# Patient Record
Sex: Male | Born: 1965 | Race: Black or African American | Hispanic: No | Marital: Married | Smoking: Never smoker
Health system: Southern US, Community
[De-identification: ages and names within clinical notes are randomized; demographics above are authoritative.]

## PROBLEM LIST (undated history)

## (undated) DIAGNOSIS — I10 Essential (primary) hypertension: Secondary | ICD-10-CM

---

## 2012-05-16 ENCOUNTER — Emergency Department (HOSPITAL_COMMUNITY)
Admission: EM | Admit: 2012-05-16 | Discharge: 2012-05-16 | Disposition: A | Discharge status: Home / Self Care | Payer: BC Managed Care – PPO | Source: Home / Self Care

## 2012-05-16 ENCOUNTER — Encounter (HOSPITAL_COMMUNITY): Payer: Self-pay | Admitting: *Deleted

## 2012-05-16 DIAGNOSIS — M79609 Pain in unspecified limb: Secondary | ICD-10-CM

## 2012-05-16 DIAGNOSIS — M79673 Pain in unspecified foot: Secondary | ICD-10-CM

## 2012-05-16 HISTORY — DX: Essential (primary) hypertension: I10

## 2012-05-16 MED ORDER — TRAMADOL HCL 50 MG PO TABS: 50.0000 mg | ORAL_TABLET | Freq: Four times a day (QID) | ORAL | Status: AC | PRN

## 2012-05-16 NOTE — ED Provider Notes (Signed)
Derrick Ray is a 46 y.o. male who presents to Urgent Care today for pain in his right heel.  Patient stepped off of a curb today while at work and felt a pop and immediate pain in his right posterior heel. He has had difficulty walking since he became injured.  He denies any other issues or problems today. He feels well. Denies any past history of foot injuries.   PMH reviewed. Significant for hypertension History  Substance Use Topics  . Smoking status: Not on file  . Smokeless tobacco: Not on file  . Alcohol Use: Not on file   ROS as above Medications reviewed. No current facility-administered medications for this encounter.   No current outpatient prescriptions on file.    Exam:  There were no vitals taken for this visit. Gen: Well NAD RIGHT FOOT:   Tender to palpation at the insertion of the Achilles tendon on the calcaneus.   Normal strength of peroneal tendon and posterior tibialis tendons. Pain with resisted plantar flexion of the foot.   Patient is unable to stand on his right toes.  Tompkins calf squeeze test is intact bilaterally   No results found for this or any previous visit (from the past 24 hour(s)). No results found.  Assessment and Plan: 46 y.o. male with suspected partial Achilles tendon tear.  Will place patient in [employer] and have him followup with sports medicine orthopedics.  Additionally we'll provide tramadol as needed for pain.  Discussed plan with patient who expresses understanding.     Rodolph Bong, MD 05/16/12 [phone]

## 2012-05-16 NOTE — Discharge Instructions (Signed)
Thank you for coming in today.  I am worried about a partial achilles tendon tear.  Please follow up with [facility]  [phone]) or any ortho practice in town. I listed [facility] but [facility], [facility], [facility] would all be OK.  Also call your occupational people at work and see who they want you to see.  Use tramadol as needed for pain.

## 2012-05-16 NOTE — ED Notes (Signed)
MD at bedside. 

## 2012-05-16 NOTE — ED Notes (Signed)
PT REPORTS STEPPING OFF SIDEWALK TODAY WITH RIGHT FOOT AND HEARING/FEELING POP IN FOOT /LOWER LEG. UNABLE TO APPLY PRESSURE TO FOOT, DIFFICULT TO WALK.

## 2012-05-17 NOTE — ED Provider Notes (Signed)
Medical screening examination/treatment/procedure(s) were performed by PGY-3 FM resident and as supervising physician I was immediately available for consultation/collaboration.   Sharin Grave, MD   Sharin Grave, MD 05/17/12 [phone]

## 2021-05-12 ENCOUNTER — Encounter: Payer: Self-pay | Admitting: Emergency Medicine

## 2021-05-12 ENCOUNTER — Other Ambulatory Visit: Payer: Self-pay

## 2021-05-12 ENCOUNTER — Emergency Department (INDEPENDENT_AMBULATORY_CARE_PROVIDER_SITE_OTHER)
Admission: EM | Admit: 2021-05-12 | Discharge: 2021-05-12 | Disposition: A | Discharge status: Home / Self Care | Payer: BC Managed Care – PPO | Source: Home / Self Care

## 2021-05-12 DIAGNOSIS — M546 Pain in thoracic spine: Secondary | ICD-10-CM

## 2021-05-12 DIAGNOSIS — S46812A Strain of other muscles, fascia and tendons at shoulder and upper arm level, left arm, initial encounter: Secondary | ICD-10-CM | POA: Diagnosis not present

## 2021-05-12 MED ORDER — CYCLOBENZAPRINE HCL 10 MG PO TABS: 10.0000 mg | ORAL_TABLET | Freq: Two times a day (BID) | ORAL | 0 refills | Status: DC | PRN

## 2021-05-12 NOTE — ED Triage Notes (Addendum)
Patient here reporting one week of pain along upper back that focuses in left upper shoulder blade area; unable to get comfortable to sleep; no known injury or motion that could have caused it; has tried OTCs. Has had covid vaccinations. Did have covid 2/21.

## 2021-05-12 NOTE — ED Provider Notes (Signed)
[facility]   [id number] 05/12/21 Arrival Time: 1846  [id number] PAIN  SUBJECTIVE: History from: patient. Derrick Ray is a 55 y.o. male complains of left thoracic back pain that began about 2-3 days ago. Denies a precipitating event or specific injury.  Describes the pain as constant and achy in character.  Has tried OTC medications without relief. Symptoms are made worse with activity. Denies similar symptoms in the past. Denies fever, chills, erythema, ecchymosis, effusion, weakness, numbness and tingling, saddle paresthesias, loss of bowel or bladder function.      ROS: As per HPI.  All other pertinent ROS negative.     Past Medical History:  Diagnosis Date   Diabetes mellitus    Hypertension    No past surgical history on file. No Known Allergies No current facility-administered medications on file prior to encounter.   Current Outpatient Medications on File Prior to Encounter  Medication Sig Dispense Refill   dapagliflozin propanediol (FARXIGA) 10 MG TABS tablet Take 10 mg by mouth daily.     metFORMIN (GLUCOPHAGE) 500 MG tablet Take by mouth daily with breakfast.     Semaglutide (OZEMPIC, 1 MG/DOSE, [place]) Inject into the skin once a week.     testosterone cypionate (DEPOTESTOSTERONE CYPIONATE) 200 MG/ML injection Inject into the muscle every 14 (fourteen) days.     insulin detemir (LEVEMIR) 100 UNIT/ML injection Inject 18 Units into the skin at bedtime.     levalbuterol (XOPENEX) 0.31 MG/3ML nebulizer solution Take 1 ampule by nebulization every 4 (four) hours as needed.     lisinopril (PRINIVIL,ZESTRIL) 10 MG tablet Take 10 mg by mouth daily.     Social History   Socioeconomic History   Marital status: Married    Spouse name: Not on file   Number of children: Not on file   Years of education: Not on file   Highest education level: Not on file  Occupational History   Not on file  Tobacco Use   Smoking status: Never   Smokeless tobacco: Not on file  Substance  and Sexual Activity   Alcohol use: No   Drug use: No   Sexual activity: Not on file  Other Topics Concern   Not on file  Social History Narrative   Not on file   Social Determinants of Health   Financial Resource Strain: Not on file  Food Insecurity: Not on file  Transportation Needs: Not on file  Physical Activity: Not on file  Stress: Not on file  Social Connections: Not on file  Intimate Partner Violence: Not on file   Family History  Family history unknown: Yes    OBJECTIVE:  Vitals:   05/12/21 1904 05/12/21 1911 05/12/21 1924  BP:  128/78   Pulse:  (!) 6 61  Resp:  16   Temp:  98.4 F (36.9 C)   TempSrc:  Oral   SpO2:  96%   Weight: 275 lb (124.7 kg)    Height: 5\' 8"  (1.727 m)      General appearance: ALERT; in no acute distress.  Head: NCAT Lungs: Normal respiratory effort CV: pulses 2+ bilaterally. Cap refill < 2 seconds Musculoskeletal:  Inspection: Skin warm, dry, clear and intact No erythema, effusion noted Palpation: Left trapezius along scapular spine tender to palpation and in spasm ROM: Limited ROM active and passive to L shoulder/neck Skin: warm and dry Neurologic: Ambulates without difficulty; Sensation intact about the upper/ lower extremities Psychological: alert and cooperative; normal mood and affect  DIAGNOSTIC STUDIES:  No results found.   ASSESSMENT & PLAN:  1. Trapezius strain, left, initial encounter   2. Acute left-sided thoracic back pain     Meds ordered this encounter  Medications   cyclobenzaprine (FLEXERIL) 10 MG tablet    Sig: Take 1 tablet (10 mg total) by mouth 2 (two) times daily as needed for muscle spasms.    Dispense:  20 tablet    Refill:  0    Order Specific Question:   Supervising Provider    Answer:   Merrilee Jansky [id number]    Continue conservative management of rest, ice, and gentle stretches Take ibuprofen as needed for pain relief (may cause abdominal discomfort, ulcers, and GI bleeds avoid taking  with other NSAIDs) Take cyclobenzaprine at nighttime for symptomatic relief. Avoid driving or operating heavy machinery while using medication. May take 800 mg ibuprofen with 1000 mg of Tylenol.  Do not exceed 4000 mg of Tylenol in 24 hours. May use heat to the area Follow up with PCP if symptoms persist Return or go to the ER if you have any new or worsening symptoms (fever, chills, chest pain, abdominal pain, changes in bowel or bladder habits, pain radiating into lower legs)    Reviewed expectations re: course of current medical issues. Questions answered. Outlined signs and symptoms indicating need for more acute intervention. Patient verbalized understanding. After Visit Summary given.        Moshe Cipro, NP 05/12/21 1935

## 2021-05-12 NOTE — Discharge Instructions (Addendum)
I would recommend getting a massage  I have sent in flexeril for you to take twice a day as needed for muscle spasms. This medication can make you sleepy. Do not drive or operate heavy machinery with this medication.  May take 800 mg ibuprofen with 1000 mg of Tylenol.  Do not exceed 4000 mg of Tylenol in 24 hours.  May use heat to the area  May use topical rubs to the area as well  Follow up with this office or with primary care if symptoms are persisting.  Follow up in the ER for high fever, trouble swallowing, trouble breathing, other concerning symptoms.

## 2021-07-07 ENCOUNTER — Emergency Department (INDEPENDENT_AMBULATORY_CARE_PROVIDER_SITE_OTHER)
Admission: EM | Admit: 2021-07-07 | Discharge: 2021-07-07 | Disposition: A | Discharge status: Home / Self Care | Payer: BC Managed Care – PPO | Source: Home / Self Care

## 2021-07-07 ENCOUNTER — Other Ambulatory Visit: Payer: Self-pay

## 2021-07-07 DIAGNOSIS — M62830 Muscle spasm of back: Secondary | ICD-10-CM | POA: Diagnosis not present

## 2021-07-07 DIAGNOSIS — S39012A Strain of muscle, fascia and tendon of lower back, initial encounter: Secondary | ICD-10-CM

## 2021-07-07 MED ORDER — BACLOFEN 10 MG PO TABS: 10.0000 mg | ORAL_TABLET | Freq: Three times a day (TID) | ORAL | 0 refills | Status: DC

## 2021-07-07 MED ORDER — PREDNISONE 20 MG PO TABS: ORAL_TABLET | ORAL | 0 refills | Status: DC

## 2021-07-07 NOTE — Discharge Instructions (Addendum)
Advised patient to take medication as directed with food to completion.  Advised patient may take Baclofen daily, or as needed.  Advised/encouraged patient to avoid moderate to strenuous activities involving lower back including twisting, turning, repetitive motion activities for the next 7 to 10 days.

## 2021-07-07 NOTE — ED Provider Notes (Signed)
[name]    CSN: [id number] Arrival date & time: 07/07/21  1702      History   Chief Complaint Chief Complaint  Patient presents with   Back Pain    HPI Derrick Ray is a 55 y.o. male.   HPI 55 year old male presents with low back pain for 1 week right-sided.  Past Medical History:  Diagnosis Date   Diabetes mellitus    Hypertension     There are no problems to display for this patient.   History reviewed. No pertinent surgical history.     Home Medications    Prior to Admission medications   Medication Sig Start Date End Date Taking? Authorizing Provider  baclofen (LIORESAL) 10 MG tablet Take 1 tablet (10 mg total) by mouth 3 (three) times daily. 07/07/21  Yes Trevor Iha, FNP  predniSONE (DELTASONE) 20 MG tablet Take 3 tabs PO daily x 5 days. 07/07/21  Yes Trevor Iha, FNP  cyclobenzaprine (FLEXERIL) 10 MG tablet Take 1 tablet (10 mg total) by mouth 2 (two) times daily as needed for muscle spasms. 05/12/21   Moshe Cipro, NP  dapagliflozin propanediol (FARXIGA) 10 MG TABS tablet Take 10 mg by mouth daily.    [provider]  insulin detemir (LEVEMIR) 100 UNIT/ML injection Inject 18 Units into the skin at bedtime.    [provider]  levalbuterol (XOPENEX) 0.31 MG/3ML nebulizer solution Take 1 ampule by nebulization every 4 (four) hours as needed.    [provider]  lisinopril (PRINIVIL,ZESTRIL) 10 MG tablet Take 10 mg by mouth daily.    [provider]  metFORMIN (GLUCOPHAGE) 500 MG tablet Take by mouth daily with breakfast.    [provider]  Semaglutide (OZEMPIC, 1 MG/DOSE, [place]) Inject into the skin once a week.    [provider]  testosterone cypionate (DEPOTESTOSTERONE CYPIONATE) 200 MG/ML injection Inject into the muscle every 14 (fourteen) days.    [provider]    Family History Family History  Family history unknown: Yes    Social History Social History    Tobacco Use   Smoking status: Never  Substance Use Topics   Alcohol use: No   Drug use: No     Allergies   Phenergan [promethazine]   Review of Systems Review of Systems  Musculoskeletal:  Positive for back pain.  All other systems reviewed and are negative.   Physical Exam Triage Vital Signs ED Triage Vitals  Enc Vitals Group     BP 07/07/21 1722 125/76     Pulse Rate 07/07/21 1722 82     Resp 07/07/21 1722 17     Temp 07/07/21 1722 98.4 F (36.9 C)     Temp Source 07/07/21 1722 Oral     SpO2 07/07/21 1722 96 %     Weight --      Height --      Head Circumference --      Peak Flow --      Pain Score 07/07/21 1725 7     Pain Loc --      Pain Edu? --      Excl. in GC? --    No data found.  Updated Vital Signs BP 125/76 (BP Location: Right Arm)   Pulse 82   Temp 98.4 F (36.9 C) (Oral)   Resp 17   SpO2 96%    Physical Exam Vitals and nursing note reviewed.  Constitutional:      General: He is not in acute  distress.    Appearance: Normal appearance. He is obese. He is not ill-appearing.  HENT:     Head: Normocephalic and atraumatic.     Mouth/Throat:     Mouth: Mucous membranes are moist.     Pharynx: Oropharynx is clear.  Eyes:     Extraocular Movements: Extraocular movements intact.     Conjunctiva/sclera: Conjunctivae normal.     Pupils: Pupils are equal, round, and reactive to light.  Cardiovascular:     Rate and Rhythm: Normal rate and regular rhythm.     Pulses: Normal pulses.     Heart sounds: Normal heart sounds.  Pulmonary:     Effort: Pulmonary effort is normal.     Breath sounds: Normal breath sounds.     Comments: No adventitious breath sounds noted Abdominal:     Tenderness: There is no right CVA tenderness or left CVA tenderness.  Musculoskeletal:        General: Tenderness present. No deformity. Normal range of motion.     Cervical back: Normal range of motion and neck supple. No tenderness.     Comments: Lumbar sacral spine  (inferior aspect): TTP over spinous processes, paraspinous muscles bilaterally and right-sided spinal erector group with numerous palpable muscle adhesions noted  Lymphadenopathy:     Cervical: No cervical adenopathy.  Skin:    General: Skin is warm and dry.  Neurological:     General: No focal deficit present.     Mental Status: He is alert and oriented to person, place, and time.  Psychiatric:        Mood and Affect: Mood normal.        Behavior: Behavior normal.     UC Treatments / Results  Labs (all labs ordered are listed, but only abnormal results are displayed) Labs Reviewed - No data to display  EKG   Radiology No results found.  Procedures Procedures (including critical care time)  Medications Ordered in UC Medications - No data to display  Initial Impression / Assessment and Plan / UC Course  I have reviewed the triage vital signs and the nursing notes.  Pertinent labs & imaging results that were available during my care of the patient were reviewed by me and considered in my medical decision making (see chart for details).    MDM: 1.  Strain of lumbar region, initial encounter-Rx'd prednisone burst x5 days; 2.  Muscle spasm of back-Rx'd baclofen. Advised patient to take medication as directed with food to completion.  Advised patient may take Baclofen daily, or as needed.  Advised/encouraged patient to avoid moderate to strenuous activities involving lower back including twisting, turning, repetitive motion activities for the next 7 to 10 days. Patient discharged home, hemodynamically stable. Final Clinical Impressions(s) / UC Diagnoses   Final diagnoses:  Strain of lumbar region, initial encounter  Muscle spasm of back     Discharge Instructions      Advised patient to take medication as directed with food to completion.  Advised patient may take Baclofen daily, or as needed.  Advised/encouraged patient to avoid moderate to strenuous activities involving  lower back including twisting, turning, repetitive motion activities for the next 7 to 10 days.     ED Prescriptions     Medication Sig Dispense Auth. Provider   predniSONE (DELTASONE) 20 MG tablet Take 3 tabs PO daily x 5 days. 15 tablet Trevor Iha, FNP   baclofen (LIORESAL) 10 MG tablet Take 1 tablet (10 mg total) by mouth 3 (three) times  daily. 30 each Trevor Iha, FNP      PDMP not reviewed this encounter.   Trevor Iha, FNP 07/07/21 1820

## 2021-07-07 NOTE — ED Triage Notes (Signed)
Pt c/o lower RT sided back pain x 1 week. Worsening in the last few days. Heating pad and topical cream prn. Pain 7/10

## 2021-07-23 ENCOUNTER — Emergency Department (INDEPENDENT_AMBULATORY_CARE_PROVIDER_SITE_OTHER)
Admission: EM | Admit: 2021-07-23 | Discharge: 2021-07-23 | Disposition: A | Discharge status: Home / Self Care | Payer: BC Managed Care – PPO | Source: Home / Self Care | Attending: Family Medicine | Admitting: Family Medicine

## 2021-07-23 ENCOUNTER — Other Ambulatory Visit: Payer: Self-pay

## 2021-07-23 DIAGNOSIS — M26622 Arthralgia of left temporomandibular joint: Secondary | ICD-10-CM

## 2021-07-23 MED ORDER — BACLOFEN 10 MG PO TABS: ORAL_TABLET | ORAL | 0 refills | Status: DC

## 2021-07-23 MED ORDER — IBUPROFEN 800 MG PO TABS: 800.0000 mg | ORAL_TABLET | Freq: Three times a day (TID) | ORAL | 0 refills | Status: DC

## 2021-07-23 NOTE — ED Provider Notes (Signed)
[name]    CSN: [id number] Arrival date & time: 07/23/21  7829      History   Chief Complaint Chief Complaint  Patient presents with   Jaw Pain    LT side    HPI Derrick Ray is a 55 y.o. male.   HPI  Patient has jaw pain for about 2 weeks.  It hurts on the left side of his jaw.  Hurts with chewing.  He has an achy feeling.  He also has pain in front of his ear on the left side.  No dental pain or gum pain.  No ear pain or decreased hearing.  No cough or cold symptoms.  No trauma that started this.  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  He has a bite guard.  He stated he started wearing this again, but it has not helped this time. Patient states he has well-controlled hypertension and diabetes. I reviewed his medical record and his kidney function is normal.  Past Medical History:  Diagnosis Date   Diabetes mellitus    Hypertension     There are no problems to display for this patient.   History reviewed. No pertinent surgical history.     Home Medications    Prior to Admission medications   Medication Sig Start Date End Date Taking? Authorizing Provider  ibuprofen (ADVIL) 800 MG tablet Take 1 tablet (800 mg total) by mouth 3 (three) times daily. 07/23/21  Yes Eustace Moore, MD  baclofen (LIORESAL) 10 MG tablet Take 1 or 2 at bedtime as needed for muscle relaxation, TMJ 07/23/21   Eustace Moore, MD  dapagliflozin propanediol (FARXIGA) 10 MG TABS tablet Take 10 mg by mouth daily.    [provider]  insulin detemir (LEVEMIR) 100 UNIT/ML injection Inject 18 Units into the skin at bedtime.    [provider]  levalbuterol (XOPENEX) 0.31 MG/3ML nebulizer solution Take 1 ampule by nebulization every 4 (four) hours as needed.    [provider]  lisinopril (PRINIVIL,ZESTRIL) 10 MG tablet Take 10 mg by mouth daily.    [provider]  metFORMIN (GLUCOPHAGE) 500 MG tablet Take by mouth daily with breakfast.    [provider]  Semaglutide (OZEMPIC, 1 MG/DOSE, [place]) Inject into the skin once a week.    [provider]  testosterone cypionate (DEPOTESTOSTERONE CYPIONATE) 200 MG/ML injection Inject into the muscle every 14 (fourteen) days.    [provider]    Family History Family History  Family history unknown: Yes    Social History Social History   Tobacco Use   Smoking status: Never  Substance Use Topics   Alcohol use: No   Drug use: No     Allergies   Phenergan [promethazine]   Review of Systems Review of Systems See HPI  Physical Exam Triage Vital Signs ED Triage Vitals  Enc Vitals Group     BP 07/23/21 0956 (!) 143/79     Pulse Rate 07/23/21 0956 (!) 58     Resp 07/23/21 0956 17     Temp 07/23/21 0956 98.2 F (36.8 C)     Temp Source 07/23/21 0956 Oral     SpO2 07/23/21 0956 95 %     Weight --      Height --      Head Circumference --      Peak Flow --      Pain Score 07/23/21 0958 6     Pain Loc --  Pain Edu? --      Excl. in GC? --    No data found.  Updated Vital Signs BP (!) 143/79 (BP Location: Right Arm)   Pulse (!) 58   Temp 98.2 F (36.8 C) (Oral)   Resp 17   SpO2 95%       Physical Exam Constitutional:      General: He is not in acute distress.    Appearance: He is well-developed.  HENT:     Head: Normocephalic and atraumatic.     Jaw: Tenderness present.     Comments: Tenderness surrounding left TMJ joint    Right Ear: Tympanic membrane and ear canal normal.     Left Ear: Tympanic membrane and ear canal normal.     Nose: Nose normal. No congestion or rhinorrhea.     Mouth/Throat:     Mouth: Mucous membranes are moist.     Pharynx: No posterior oropharyngeal erythema.     Comments: Dentition in good repair Eyes:     Conjunctiva/sclera: Conjunctivae normal.     Pupils: Pupils are equal, round, and reactive to light.  Cardiovascular:     Rate and Rhythm: Normal rate.  Pulmonary:     Effort: Pulmonary effort is  normal. No respiratory distress.  Abdominal:     General: There is no distension.     Palpations: Abdomen is soft.  Musculoskeletal:        General: Normal range of motion.     Cervical back: Normal range of motion.  Skin:    General: Skin is warm and dry.  Neurological:     Mental Status: He is alert.     UC Treatments / Results  Labs (all labs ordered are listed, but only abnormal results are displayed) Labs Reviewed - No data to display  EKG   Radiology No results found.  Procedures Procedures (including critical care time)  Medications Ordered in UC Medications - No data to display  Initial Impression / Assessment and Plan / UC Course  I have reviewed the triage vital signs and the nursing notes.  Pertinent labs & imaging results that were available during my care of the patient were reviewed by me and considered in my medical decision making (see chart for details).     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  It is not improving with his bite guard.  Discussed adding in addition anti-inflammatory medication, warm compresses, soft diet, and a muscle relaxer at bedtime.  See primary care fails to improve Final Clinical Impressions(s) / UC Diagnoses   Final diagnoses:  Arthralgia of left temporomandibular joint     Discharge Instructions      Take ibuprofen 3 times a day with food Soft diet until your jaw pain improves May try warm compresses to area Take baclofen 1 or 2 pills at bedtime to help relax muscles Wear mouthguard until pain improves Follow-up with your primary care doctor   ED Prescriptions     Medication Sig Dispense Auth. Provider   baclofen (LIORESAL) 10 MG tablet Take 1 or 2 at bedtime as needed for muscle relaxation, TMJ 30 each Eustace Moore, MD   ibuprofen (ADVIL) 800 MG tablet Take 1 tablet (800 mg total) by mouth 3 (three) times daily. 21 tablet Eustace Moore, MD      PDMP not reviewed this encounter.   Eustace Moore,  MD 07/23/21 [phone]

## 2021-07-23 NOTE — Discharge Instructions (Addendum)
Take ibuprofen 3 times a day with food Soft diet until your jaw pain improves May try warm compresses to area Take baclofen 1 or 2 pills at bedtime to help relax muscles Wear mouthguard until pain improves Follow-up with your primary care doctor

## 2021-07-23 NOTE — ED Triage Notes (Signed)
Pt c/o LT sided jaw pain x 2 weeks. Says he feels like theres a knot in front of his LT ear. Hurts worse when eating or moving jaw. Pain 6/10

## 2021-10-02 ENCOUNTER — Other Ambulatory Visit: Payer: Self-pay

## 2021-10-02 ENCOUNTER — Emergency Department (INDEPENDENT_AMBULATORY_CARE_PROVIDER_SITE_OTHER)
Admission: EM | Admit: 2021-10-02 | Discharge: 2021-10-02 | Disposition: A | Discharge status: Home / Self Care | Payer: BC Managed Care – PPO | Source: Home / Self Care | Attending: Family Medicine | Admitting: Family Medicine

## 2021-10-02 DIAGNOSIS — J069 Acute upper respiratory infection, unspecified: Secondary | ICD-10-CM | POA: Diagnosis not present

## 2021-10-02 MED ORDER — PREDNISONE 20 MG PO TABS: 20.0000 mg | ORAL_TABLET | Freq: Two times a day (BID) | ORAL | 0 refills | Status: DC

## 2021-10-02 MED ORDER — BENZONATATE 200 MG PO CAPS: 200.0000 mg | ORAL_CAPSULE | Freq: Three times a day (TID) | ORAL | 0 refills | Status: DC | PRN

## 2021-10-02 NOTE — ED Triage Notes (Signed)
Pt presents with headache, nasal congestion and cough that began monday

## 2021-10-02 NOTE — ED Provider Notes (Signed)
[name]    CSN: [id number] Arrival date & time: 10/02/21  1620      History   Chief Complaint Chief Complaint  Patient presents with   Nasal Congestion   Headache   Cough    HPI Derrick Ray is a 55 y.o. male.   HPI Sim states that he has kids at home have been sick with the flu.  His wife has had a sinus infection.  He has had coughing postnasal drip sinus pressure and pain and headache for 4 days.  States it is a harsh cough.  No fever or chills.  No body aches.  He does not think it is flu.  He has not had a flu shot yet this year  Past Medical History:  Diagnosis Date   Diabetes mellitus    Hypertension     There are no problems to display for this patient.   History reviewed. No pertinent surgical history.     Home Medications    Prior to Admission medications   Medication Sig Start Date End Date Taking? Authorizing Provider  benzonatate (TESSALON) 200 MG capsule Take 1 capsule (200 mg total) by mouth 3 (three) times daily as needed for cough. 10/02/21  Yes Eustace Moore, MD  predniSONE (DELTASONE) 20 MG tablet Take 1 tablet (20 mg total) by mouth 2 (two) times daily with a meal. 10/02/21  Yes Eustace Moore, MD  baclofen (LIORESAL) 10 MG tablet Take 1 or 2 at bedtime as needed for muscle relaxation, TMJ Patient not taking: Reported on 10/02/2021 07/23/21   Eustace Moore, MD  dapagliflozin propanediol (FARXIGA) 10 MG TABS tablet Take 10 mg by mouth daily.    [provider]  ibuprofen (ADVIL) 800 MG tablet Take 1 tablet (800 mg total) by mouth 3 (three) times daily. 07/23/21   Eustace Moore, MD  insulin detemir (LEVEMIR) 100 UNIT/ML injection Inject 18 Units into the skin at bedtime.    [provider]  levalbuterol (XOPENEX) 0.31 MG/3ML nebulizer solution Take 1 ampule by nebulization every 4 (four) hours as needed. Patient not taking: Reported on 10/02/2021    [provider]  lisinopril  (PRINIVIL,ZESTRIL) 10 MG tablet Take 10 mg by mouth daily.    [provider]  metFORMIN (GLUCOPHAGE) 500 MG tablet Take by mouth daily with breakfast.    [provider]  Semaglutide (OZEMPIC, 1 MG/DOSE, [place]) Inject into the skin once a week.    [provider]  testosterone cypionate (DEPOTESTOSTERONE CYPIONATE) 200 MG/ML injection Inject into the muscle every 14 (fourteen) days.    [provider]    Family History Family History  Family history unknown: Yes    Social History Social History   Tobacco Use   Smoking status: Never   Smokeless tobacco: Never  Substance Use Topics   Alcohol use: No   Drug use: No     Allergies   Phenergan [promethazine]   Review of Systems Review of Systems See HPI  Physical Exam Triage Vital Signs ED Triage Vitals  Enc Vitals Group     BP 10/02/21 1634 135/77     Pulse Rate 10/02/21 1634 93     Resp 10/02/21 1634 14     Temp 10/02/21 1634 99.1 F (37.3 C)     Temp Source 10/02/21 1634 Oral     SpO2 10/02/21 1634 96 %     Weight --      Height --  Head Circumference --      Peak Flow --      Pain Score 10/02/21 1636 4     Pain Loc --      Pain Edu? --      Excl. in GC? --    No data found.  Updated Vital Signs BP 135/77 (BP Location: Left Arm)   Pulse 93   Temp 99.1 F (37.3 C) (Oral)   Resp 14   SpO2 96%      Physical Exam Constitutional:      General: He is not in acute distress.    Appearance: He is obese. He is ill-appearing.  HENT:     Head: Normocephalic and atraumatic.     Right Ear: Tympanic membrane, ear canal and external ear normal.     Left Ear: Tympanic membrane, ear canal and external ear normal.     Nose: Congestion and rhinorrhea present.     Mouth/Throat:     Mouth: Mucous membranes are moist.     Pharynx: Posterior oropharyngeal erythema present.  Eyes:     Conjunctiva/sclera: Conjunctivae normal.     Pupils: Pupils are equal, round, and reactive to  light.  Cardiovascular:     Rate and Rhythm: Normal rate and regular rhythm.     Heart sounds: Normal heart sounds.  Pulmonary:     Effort: Pulmonary effort is normal. No respiratory distress.     Breath sounds: Wheezing present.  Abdominal:     General: There is no distension.     Palpations: Abdomen is soft.  Musculoskeletal:        General: Normal range of motion.     Cervical back: Normal range of motion.  Lymphadenopathy:     Cervical: No cervical adenopathy.  Skin:    General: Skin is warm and dry.  Neurological:     Mental Status: He is alert.   Few scattered wheeze.  No rales  UC Treatments / Results  Labs (all labs ordered are listed, but only abnormal results are displayed) Labs Reviewed - No data to display  EKG   Radiology No results found.  Procedures Procedures (including critical care time)  Medications Ordered in UC Medications - No data to display  Initial Impression / Assessment and Plan / UC Course  I have reviewed the triage vital signs and the nursing notes.  Pertinent labs & imaging results that were available during my care of the patient were reviewed by me and considered in my medical decision making (see chart for details).     Discussed that he does not have the type of infection that an antibiotic will help.  We will treat him with prednisone and Tessalon.  Fluids and rest.  Return if needed Final Clinical Impressions(s) / UC Diagnoses   Final diagnoses:  Acute upper respiratory infection     Discharge Instructions      Drink lots of fluids Take the prednisone 2 times a day for 5 days.  This will help with the swelling and drainage in your sinuses Take Tessalon 2-3 times a day for the cough See your doctor if not improving by next week     ED Prescriptions     Medication Sig Dispense Auth. Provider   predniSONE (DELTASONE) 20 MG tablet Take 1 tablet (20 mg total) by mouth 2 (two) times daily with a meal. 10 tablet Eustace Moore, MD   benzonatate (TESSALON) 200 MG capsule Take 1 capsule (200 mg total) by mouth  3 (three) times daily as needed for cough. 21 capsule Eustace Moore, MD      PDMP not reviewed this encounter.   Eustace Moore, MD 10/02/21 [phone]

## 2021-10-02 NOTE — Discharge Instructions (Signed)
Drink lots of fluids Take the prednisone 2 times a day for 5 days.  This will help with the swelling and drainage in your sinuses Take Tessalon 2-3 times a day for the cough See your doctor if not improving by next week

## 2021-10-30 ENCOUNTER — Emergency Department (INDEPENDENT_AMBULATORY_CARE_PROVIDER_SITE_OTHER): Payer: BC Managed Care – PPO

## 2021-10-30 ENCOUNTER — Other Ambulatory Visit: Payer: Self-pay

## 2021-10-30 ENCOUNTER — Encounter: Payer: Self-pay | Admitting: Emergency Medicine

## 2021-10-30 ENCOUNTER — Emergency Department (INDEPENDENT_AMBULATORY_CARE_PROVIDER_SITE_OTHER)
Admission: EM | Admit: 2021-10-30 | Discharge: 2021-10-30 | Disposition: A | Discharge status: Home / Self Care | Payer: BC Managed Care – PPO | Source: Home / Self Care | Attending: Family Medicine | Admitting: Family Medicine

## 2021-10-30 DIAGNOSIS — M5442 Lumbago with sciatica, left side: Secondary | ICD-10-CM | POA: Diagnosis not present

## 2021-10-30 DIAGNOSIS — M5432 Sciatica, left side: Secondary | ICD-10-CM

## 2021-10-30 MED ORDER — TIZANIDINE HCL 4 MG PO TABS: 4.0000 mg | ORAL_TABLET | Freq: Four times a day (QID) | ORAL | 0 refills | Status: DC | PRN

## 2021-10-30 MED ORDER — METHYLPREDNISOLONE 4 MG PO TBPK: ORAL_TABLET | ORAL | 0 refills | Status: DC

## 2021-10-30 NOTE — ED Triage Notes (Signed)
Left side back pain and sciatica x 4 days denies injury.

## 2021-10-30 NOTE — Discharge Instructions (Signed)
Take the Medrol Dosepak as directed.  Take all of day 1 today.  3 pills now and then 3 pills at bedtime Take tizanidine as needed as a muscle relaxer.  Again make sure you take at bedtime Ice or heat to painful area Rest over the weekend Call your doctor if not improving by Monday Be careful with your diabetic diet while you are taking the Medrol

## 2021-10-30 NOTE — ED Provider Notes (Signed)
[name]    CSN: [id number] Arrival date & time: 10/30/21  1534      History   Chief Complaint Chief Complaint  Patient presents with   Back Pain    HPI Derrick Ray is a 55 y.o. male.   HPI  Patient has low back pain.  This is a second time its happened in the last few months.  Its in the left low back and radiates down into his left hip.  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  His second job involves a lot of pushing and heavy activities.  He does not remember any specific injury.  No trauma. Patient is a well-controlled diabetic.  His last hemoglobin A1c was 7.4 Well-controlled hypertension.  Compliant with care  Past Medical History:  Diagnosis Date   Diabetes mellitus    Hypertension     There are no problems to display for this patient.   History reviewed. No pertinent surgical history.     Home Medications    Prior to Admission medications   Medication Sig Start Date End Date Taking? Authorizing Provider  methylPREDNISolone (MEDROL DOSEPAK) 4 MG TBPK tablet tad 10/30/21  Yes Eustace Moore, MD  Naltrexone-buPROPion HCl ER (CONTRAVE) 8-90 MG TB12 Take by mouth.   Yes [provider]  tiZANidine (ZANAFLEX) 4 MG tablet Take 1-2 tablets (4-8 mg total) by mouth every 6 (six) hours as needed for muscle spasms. 10/30/21  Yes Eustace Moore, MD  dapagliflozin propanediol (FARXIGA) 10 MG TABS tablet Take 10 mg by mouth daily.    [provider]  ibuprofen (ADVIL) 800 MG tablet Take 1 tablet (800 mg total) by mouth 3 (three) times daily. 07/23/21   Eustace Moore, MD  insulin detemir (LEVEMIR) 100 UNIT/ML injection Inject 18 Units into the skin at bedtime.    [provider]  levalbuterol (XOPENEX) 0.31 MG/3ML nebulizer solution Take 1 ampule by nebulization every 4 (four) hours as needed. Patient not taking: Reported on 10/02/2021    [provider]  lisinopril (PRINIVIL,ZESTRIL) 10 MG tablet Take  10 mg by mouth daily.    [provider]  metFORMIN (GLUCOPHAGE) 500 MG tablet Take by mouth daily with breakfast.    [provider]  Semaglutide (OZEMPIC, 1 MG/DOSE, [place]) Inject into the skin once a week.    [provider]  testosterone cypionate (DEPOTESTOSTERONE CYPIONATE) 200 MG/ML injection Inject into the muscle every 14 (fourteen) days.    [provider]    Family History Family History  Problem Relation Age of Onset   Diabetes Mother    Diabetes Father     Social History Social History   Tobacco Use   Smoking status: Never   Smokeless tobacco: Never  Vaping Use   Vaping Use: Never used  Substance Use Topics   Alcohol use: No   Drug use: No     Allergies   Phenergan [promethazine]   Review of Systems Review of Systems  See HPI Physical Exam Triage Vital Signs ED Triage Vitals  Enc Vitals Group     BP 10/30/21 1606 131/77     Pulse Rate 10/30/21 1606 (!) 59     Resp 10/30/21 1606 18     Temp 10/30/21 1606 98.8 F (37.1 C)     Temp Source 10/30/21 1606 Oral     SpO2 10/30/21 1606 97 %     Weight 10/30/21 1610 265 lb (120.2 kg)  Height 10/30/21 1610 5\' 8"  (1.727 m)     Head Circumference --      Peak Flow --      Pain Score 10/30/21 1609 8     Pain Loc --      Pain Edu? --      Excl. in GC? --    No data found.  Updated Vital Signs BP 131/77 (BP Location: Left Arm)   Pulse (!) 59   Temp 98.8 F (37.1 C) (Oral)   Resp 18   Ht 5\' 8"  (1.727 m)   Wt 120.2 kg   SpO2 97%   BMI 40.29 kg/m      Physical Exam Constitutional:      General: He is not in acute distress.    Appearance: He is well-developed.  HENT:     Head: Normocephalic and atraumatic.     Nose:     Comments: Mask is in place Eyes:     Conjunctiva/sclera: Conjunctivae normal.     Pupils: Pupils are equal, round, and reactive to light.  Cardiovascular:     Rate and Rhythm: Normal rate.  Pulmonary:     Effort: Pulmonary effort is  normal. No respiratory distress.  Abdominal:     General: There is no distension.     Palpations: Abdomen is soft.  Musculoskeletal:        General: Tenderness present. Normal range of motion.     Cervical back: Normal range of motion.     Right lower leg: No edema.     Left lower leg: No edema.     Comments: Tenderness to palpation of the left SI joint.  Tenderness over the left lumbar column of muscles with mild increased muscle tone.  Straight leg raise is negative bilaterally.  Skin:    General: Skin is warm and dry.  Neurological:     General: No focal deficit present.     Mental Status: He is alert.     Gait: Gait normal.     Deep Tendon Reflexes: Reflexes normal.  Psychiatric:        Mood and Affect: Mood normal.        Behavior: Behavior normal.     UC Treatments / Results  Labs (all labs ordered are listed, but only abnormal results are displayed) Labs Reviewed - No data to display  EKG   Radiology DG Lumbar Spine Complete  Result Date: 10/30/2021 CLINICAL DATA:  Left sciatica EXAM: LUMBAR SPINE - COMPLETE 4+ VIEW COMPARISON:  None. FINDINGS: There is no evidence of lumbar spine fracture. Alignment is normal. Disc spaces are preserved. Mild degenerative endplate osteophytes are seen at L4 and L5. Soft tissues are within normal limits. IMPRESSION: 1. No acute fracture or malalignment. 2. Minimal degenerative changes. Electronically Signed   By: M.D.   On: 10/30/2021 17:30    Procedures Procedures (including critical care time)  Medications Ordered in UC Medications - No data to display  Initial Impression / Assessment and Plan / UC Course  I have reviewed the triage vital signs and the nursing notes.  Pertinent labs & imaging results that were available during my care of the patient were reviewed by me and considered in my medical decision making (see chart for details).     Patient has some mild arthritic changes.  He was worried aboutRepeated  injury to his back given his heavy work.  He inquired about physical therapy.  I told him this would need to come  through his primary care doctor Final Clinical Impressions(s) / UC Diagnoses   Final diagnoses:  Acute left-sided low back pain with left-sided sciatica     Discharge Instructions      Take the Medrol Dosepak as directed.  Take all of day 1 today.  3 pills now and then 3 pills at bedtime Take tizanidine as needed as a muscle relaxer.  Again make sure you take at bedtime Ice or heat to painful area Rest over the weekend Call your doctor if not improving by Monday Be careful with your diabetic diet while you are taking the Medrol     ED Prescriptions     Medication Sig Dispense Auth. Provider   methylPREDNISolone (MEDROL DOSEPAK) 4 MG TBPK tablet tad 21 tablet Eustace Moore, MD   tiZANidine (ZANAFLEX) 4 MG tablet Take 1-2 tablets (4-8 mg total) by mouth every 6 (six) hours as needed for muscle spasms. 21 tablet Eustace Moore, MD      PDMP not reviewed this encounter.   Eustace Moore, MD 10/30/21 [phone]

## 2022-01-07 ENCOUNTER — Emergency Department (INDEPENDENT_AMBULATORY_CARE_PROVIDER_SITE_OTHER)
Admission: EM | Admit: 2022-01-07 | Discharge: 2022-01-07 | Disposition: A | Discharge status: Home / Self Care | Payer: BC Managed Care – PPO | Source: Home / Self Care

## 2022-01-07 ENCOUNTER — Telehealth: Payer: Self-pay

## 2022-01-07 ENCOUNTER — Other Ambulatory Visit: Payer: Self-pay

## 2022-01-07 DIAGNOSIS — S39012A Strain of muscle, fascia and tendon of lower back, initial encounter: Secondary | ICD-10-CM | POA: Diagnosis not present

## 2022-01-07 MED ORDER — PREDNISONE 20 MG PO TABS: ORAL_TABLET | ORAL | 0 refills | Status: DC

## 2022-01-07 MED ORDER — CYCLOBENZAPRINE HCL 10 MG PO TABS: 10.0000 mg | ORAL_TABLET | Freq: Two times a day (BID) | ORAL | 0 refills | Status: DC | PRN

## 2022-01-07 NOTE — Discharge Instructions (Addendum)
Use ice or heat to painful muscles Gentle stretching twice a day Take medication as prescribed The muscle relaxer may cause drowsiness.  Do not take when driving See your doctor about physical therapy referral if your back pain persists

## 2022-01-07 NOTE — ED Triage Notes (Addendum)
Pt c/o lower back pain and LT hip pain since Monday. Hx of sciatica. Stretching and ice prn. Woke up with worse pain this am.  Was seen in UC beginning on Dec for same problem. Given steroids and muscle relaxer. Pain 8/10

## 2022-01-07 NOTE — ED Provider Notes (Signed)
[facility]    CSN: [id number] Arrival date & time: 01/07/22  1153      History   Chief Complaint Chief Complaint  Patient presents with   Back Pain    lower   Hip Pain    LT    HPI Derrick Ray is a 56 y.o. male.   HPI  Pleasant gentleman who I have seen before for low back pain has a flare his pain again.  He states he is having pain in the left low back that goes into his buttock.  It hurts with certain movements.  He is not getting better with over-the-counter medications.  He states last time he did get better with the steroid, but does not think the muscle relaxer helped him much.  We will try different muscle relaxers, and I have advised that he talk to his primary care doctor about a possible physical therapy referral  Past Medical History:  Diagnosis Date   Diabetes mellitus    Hypertension     There are no problems to display for this patient.   History reviewed. No pertinent surgical history.     Home Medications    Prior to Admission medications   Medication Sig Start Date End Date Taking? Authorizing Provider  cyclobenzaprine (FLEXERIL) 10 MG tablet Take 1 tablet (10 mg total) by mouth 2 (two) times daily as needed for muscle spasms. 01/07/22  Yes Eustace Moore, MD  predniSONE (DELTASONE) 20 MG tablet Take 3 pills today.  Starting tomorrow take 2 pills a day for 5 days then 1 pill a day for 5 days 01/07/22  Yes Eustace Moore, MD  dapagliflozin propanediol (FARXIGA) 10 MG TABS tablet Take 10 mg by mouth daily.    [provider]  ibuprofen (ADVIL) 800 MG tablet Take 1 tablet (800 mg total) by mouth 3 (three) times daily. 07/23/21   Eustace Moore, MD  insulin detemir (LEVEMIR) 100 UNIT/ML injection Inject 18 Units into the skin at bedtime.    [provider]  lisinopril (PRINIVIL,ZESTRIL) 10 MG tablet Take 10 mg by mouth daily.    [provider]  metFORMIN (GLUCOPHAGE) 500 MG tablet Take by mouth daily with  breakfast.    [provider]  Naltrexone-buPROPion HCl ER (CONTRAVE) 8-90 MG TB12 Take by mouth.    [provider]  Semaglutide (OZEMPIC, 1 MG/DOSE, [place]) Inject into the skin once a week.    [provider]  testosterone cypionate (DEPOTESTOSTERONE CYPIONATE) 200 MG/ML injection Inject into the muscle every 14 (fourteen) days.    [provider]    Family History Family History  Problem Relation Age of Onset   Diabetes Mother    Diabetes Father     Social History Social History   Tobacco Use   Smoking status: Never   Smokeless tobacco: Never  Vaping Use   Vaping Use: Never used  Substance Use Topics   Alcohol use: No   Drug use: No     Allergies   Phenergan [promethazine]   Review of Systems Review of Systems See HPI  Physical Exam Triage Vital Signs ED Triage Vitals  Enc Vitals Group     BP 01/07/22 1202 (!) 154/80     Pulse Rate 01/07/22 1202 70     Resp 01/07/22 1202 18     Temp 01/07/22 1202 97.7 F (36.5 C)     Temp Source 01/07/22 1202 Oral     SpO2 01/07/22 1202 97 %  Weight --      Height --      Head Circumference --      Peak Flow --      Pain Score 01/07/22 1204 8     Pain Loc --      Pain Edu? --      Excl. in GC? --    No data found.  Updated Vital Signs BP (!) 154/80 (BP Location: Right Arm)    Pulse 70    Temp 97.7 F (36.5 C) (Oral)    Resp 18    SpO2 97%       Physical Exam Constitutional:      General: He is not in acute distress.    Appearance: He is well-developed. He is obese.  HENT:     Head: Normocephalic and atraumatic.  Eyes:     Conjunctiva/sclera: Conjunctivae normal.     Pupils: Pupils are equal, round, and reactive to light.  Cardiovascular:     Rate and Rhythm: Normal rate.  Pulmonary:     Effort: Pulmonary effort is normal. No respiratory distress.  Abdominal:     General: There is no distension.     Palpations: Abdomen is soft.  Musculoskeletal:        General: Normal  range of motion.     Cervical back: Normal range of motion.     Comments: Tenderness in the left lumbar, muscles and left SI region.  No palpable muscle spasm.  Full but slow range of motion.  Straight leg raise is positive on the left for increased buttock pain  Skin:    General: Skin is warm and dry.  Neurological:     General: No focal deficit present.     Mental Status: He is alert.     Sensory: No sensory deficit.     Motor: No weakness.     Gait: Gait normal.     Deep Tendon Reflexes: Reflexes normal.  Psychiatric:        Mood and Affect: Mood normal.        Behavior: Behavior normal.     UC Treatments / Results  Labs (all labs ordered are listed, but only abnormal results are displayed) Labs Reviewed - No data to display  EKG   Radiology No results found.  Procedures Procedures (including critical care time)  Medications Ordered in UC Medications - No data to display  Initial Impression / Assessment and Plan / UC Course  I have reviewed the triage vital signs and the nursing notes.  Pertinent labs & imaging results that were available during my care of the patient were reviewed by me and considered in my medical decision making (see chart for details).     Final Clinical Impressions(s) / UC Diagnoses   Final diagnoses:  Strain of lumbar region, initial encounter     Discharge Instructions      Use ice or heat to painful muscles Gentle stretching twice a day Take medication as prescribed The muscle relaxer may cause drowsiness.  Do not take when driving See your doctor about physical therapy referral if your back pain persists     ED Prescriptions     Medication Sig Dispense Auth. Provider   predniSONE (DELTASONE) 20 MG tablet Take 3 pills today.  Starting tomorrow take 2 pills a day for 5 days then 1 pill a day for 5 days 10 tablet Eustace Moore, MD   cyclobenzaprine (FLEXERIL) 10 MG tablet Take 1 tablet (10 mg total)  by mouth 2 (two) times  daily as needed for muscle spasms. 20 tablet Eustace Moore, MD      PDMP not reviewed this encounter.   Eustace Moore, MD 01/07/22 [phone]

## 2022-01-07 NOTE — Telephone Encounter (Signed)
Pharmacy called to verify script. Derrick Ray was supposed to be 18 to finished taperpak. Confirmed with Dr Delton See and pharmacy will fill the remaining pills.

## 2023-01-31 IMAGING — DX DG LUMBAR SPINE COMPLETE 4+V
5 series · 5 of 5 positions shown · non-contrast
Comparison: None.

CLINICAL DATA: Left sciatica

EXAM:
LUMBAR SPINE - COMPLETE 4+ VIEW

[l-spine ap]
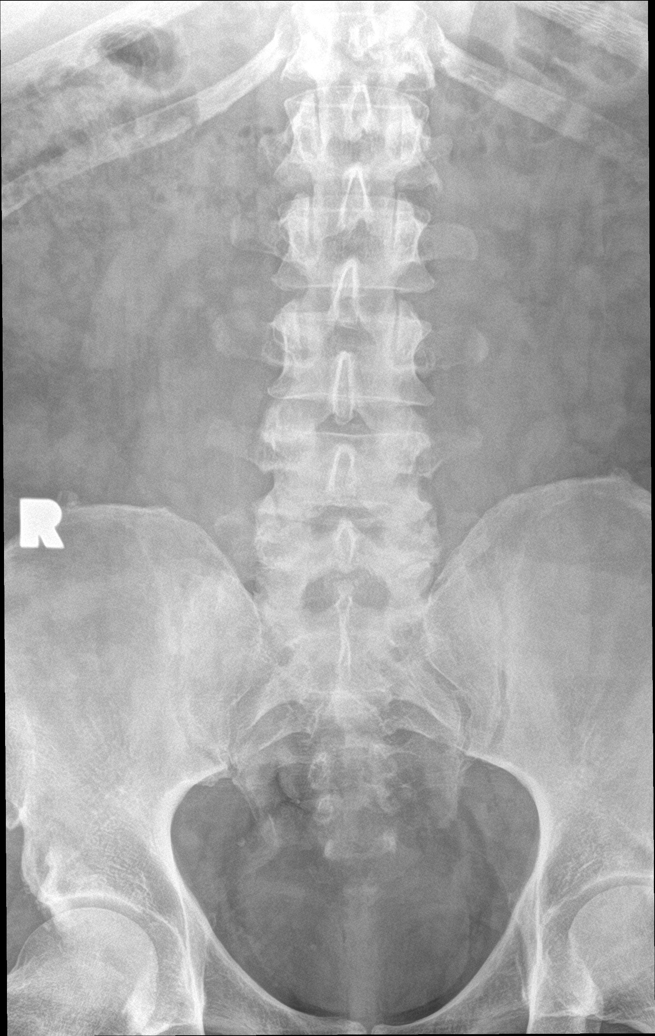

[l-spine obl (1 of 2)]
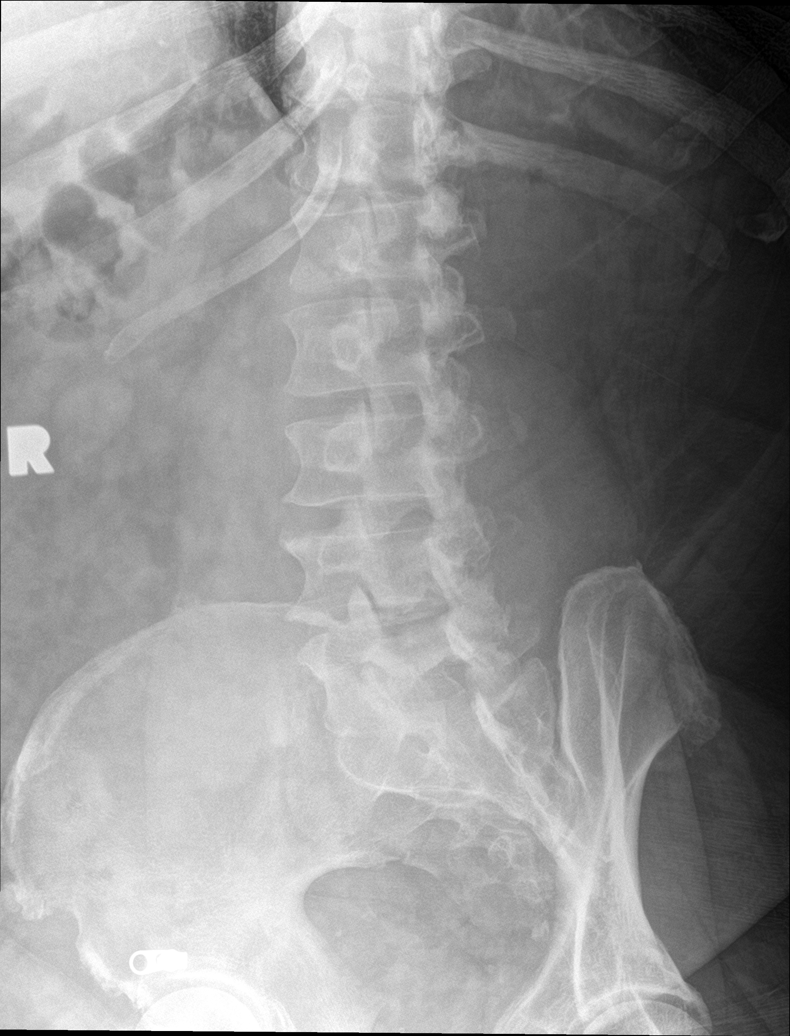

[l-spine obl (2 of 2)]
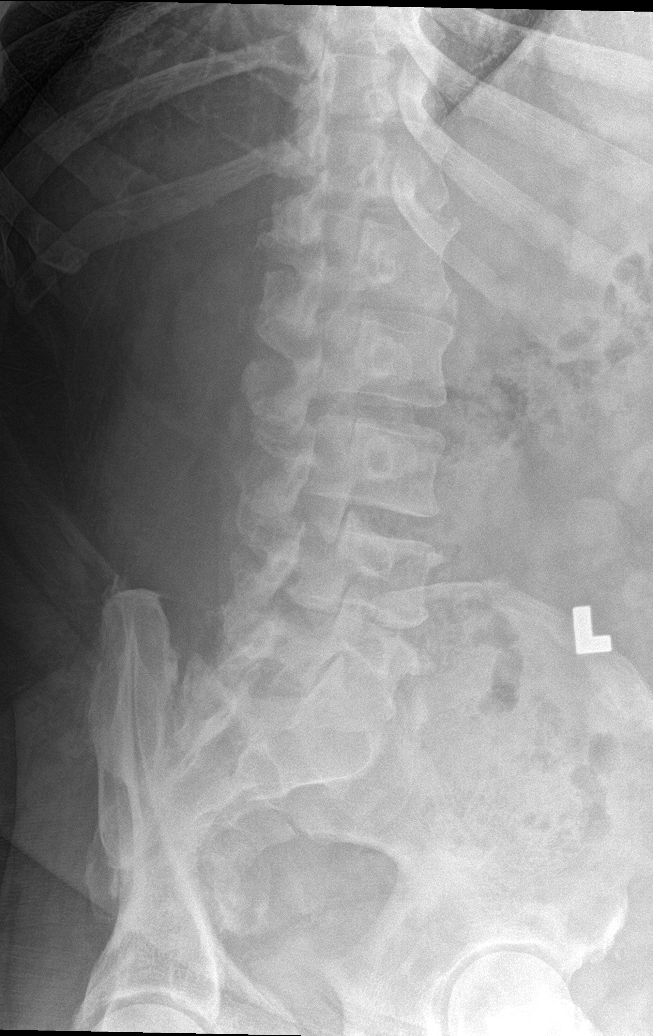

[l-spine lat]
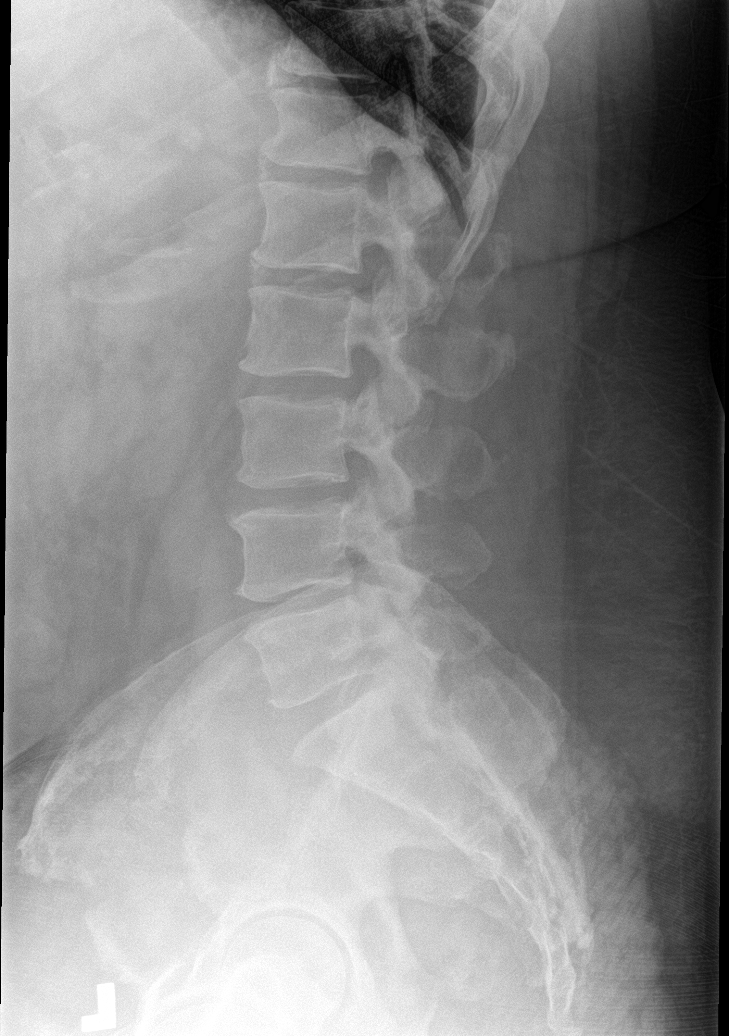

[l-spine spot]
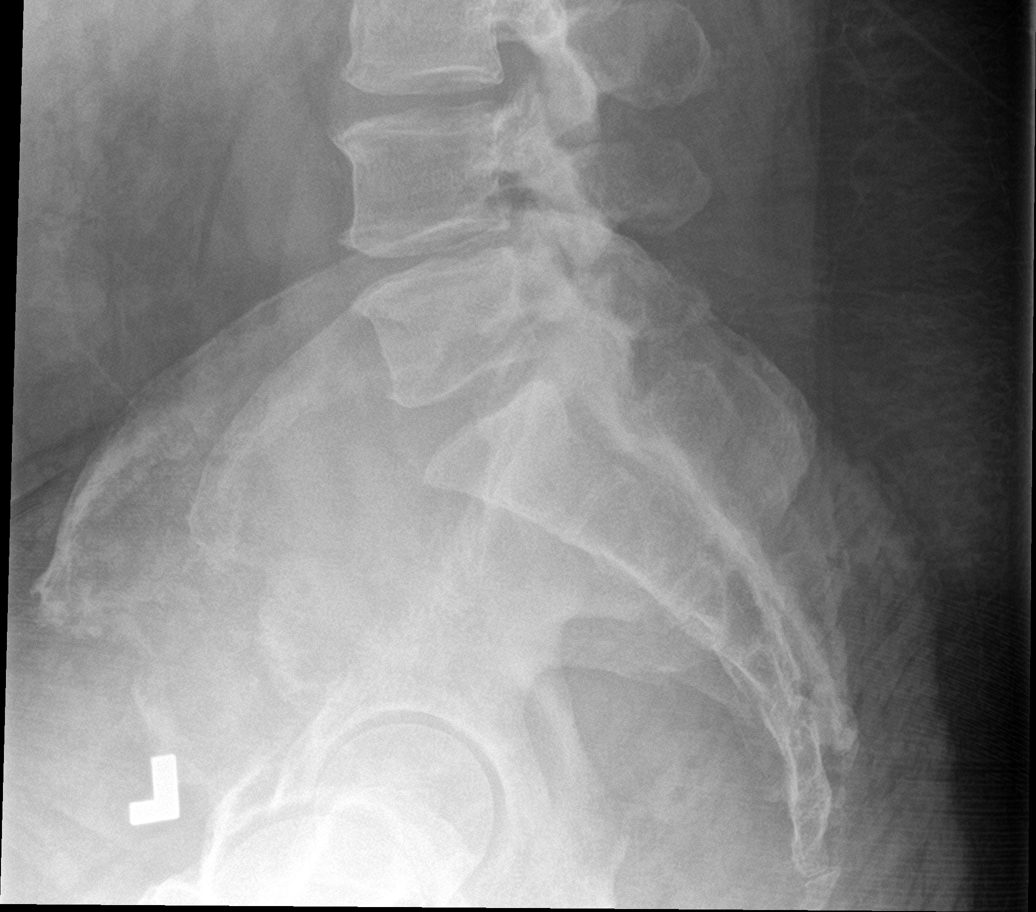

[5 of 5 positions shown; findings below may reference images not displayed]

FINDINGS: There is no evidence of lumbar spine fracture. Alignment is normal.
Disc spaces are preserved. Mild degenerative endplate osteophytes
are seen at L4 and L5. Soft tissues are within normal limits.
IMPRESSION: 1. No acute fracture or malalignment.
2. Minimal degenerative changes.

## 2024-04-30 ENCOUNTER — Ambulatory Visit (INDEPENDENT_AMBULATORY_CARE_PROVIDER_SITE_OTHER)

## 2024-04-30 ENCOUNTER — Ambulatory Visit: Admission: EM | Admit: 2024-04-30 | Discharge: 2024-04-30 | Disposition: A | Discharge status: Home / Self Care

## 2024-04-30 ENCOUNTER — Other Ambulatory Visit: Payer: Self-pay

## 2024-04-30 ENCOUNTER — Encounter: Payer: Self-pay | Admitting: Emergency Medicine

## 2024-04-30 DIAGNOSIS — M25462 Effusion, left knee: Secondary | ICD-10-CM | POA: Diagnosis not present

## 2024-04-30 DIAGNOSIS — S86912A Strain of unspecified muscle(s) and tendon(s) at lower leg level, left leg, initial encounter: Secondary | ICD-10-CM | POA: Diagnosis not present

## 2024-04-30 DIAGNOSIS — M25562 Pain in left knee: Secondary | ICD-10-CM | POA: Diagnosis not present

## 2024-04-30 MED ORDER — OXYCODONE-ACETAMINOPHEN 7.5-325 MG PO TABS: 1.0000 | ORAL_TABLET | Freq: Three times a day (TID) | ORAL | 0 refills | Status: AC | PRN

## 2024-04-30 MED ORDER — CELECOXIB 200 MG PO CAPS: 200.0000 mg | ORAL_CAPSULE | Freq: Every day | ORAL | 0 refills | Status: AC

## 2024-04-30 NOTE — ED Triage Notes (Signed)
 Patient presents to Urgent Care with complaints of left knee pain since 1 day ago. Patient reports was working in the yard. Denies any injury to the knee. Started having swelling pain. Pain with walking, weight bearing. Applied ice to the knee.

## 2024-04-30 NOTE — Discharge Instructions (Addendum)
 Advised patient may RICE affected area of left knee for 20 minutes 3 times daily for the next 3 days.  Advised patient to take medication (Celebrex) as directed with food to completion.  Advised patient may take Percocet daily or as needed for left acute/severe knee pain.  Patient advised of sedative effects of this medication.  Encouraged to increase daily water intake to 64 ounces per day while taking these medications.  Advised if symptoms worsen and/or unresolved please follow-up with your PCP or [facility] orthopedics for further evaluation.  Contact information provided with his AVS today.

## 2024-04-30 NOTE — ED Provider Notes (Signed)
 [facility]    CSN: [id number] Arrival date & time: 04/30/24  1118      History   Chief Complaint Chief Complaint  Patient presents with   Knee Pain    Left    HPI Derrick Ray is a 58 y.o. male.   HPI 58 year old male presents with left knee pain secondary to left knee injury possibly while working in yard.  Patient is accompanied by his wife this afternoon. PMH significant for morbid/severe obesity, T2DM, and HTN.  Past Medical History:  Diagnosis Date   Diabetes mellitus    Hypertension     There are no active problems to display for this patient.   History reviewed. No pertinent surgical history.     Home Medications    Prior to Admission medications   Medication Sig Start Date End Date Taking? Authorizing Provider  celecoxib (CELEBREX) 200 MG capsule Take 1 capsule (200 mg total) by mouth daily for 15 days. 04/30/24 05/15/24 Yes Leonides Ramp, FNP  dapagliflozin propanediol (FARXIGA) 10 MG TABS tablet Take 10 mg by mouth daily.   Yes [provider]  LANTUS SOLOSTAR 100 UNIT/ML Solostar Pen SMARTSIG:70 Unit(s) SUB-Q Every Night   Yes [provider]  lisinopril (PRINIVIL,ZESTRIL) 10 MG tablet Take 10 mg by mouth daily.   Yes [provider]  metFORMIN (GLUCOPHAGE) 500 MG tablet Take by mouth daily with breakfast.   Yes [provider]  MOUNJARO 15 MG/0.5ML Pen Inject 15 mg into the skin. 04/20/24  Yes [provider]  oxyCODONE-acetaminophen (PERCOCET) 7.5-325 MG tablet Take 1 tablet by mouth every 8 (eight) hours as needed for severe pain (pain score 7-10). 04/30/24  Yes Leonides Ramp, FNP  testosterone cypionate (DEPOTESTOSTERONE CYPIONATE) 200 MG/ML injection Inject into the muscle every 14 (fourteen) days.   Yes [provider]    Family History Family History  Problem Relation Age of Onset   Diabetes Mother    Diabetes Father     Social History Social History   Tobacco Use   Smoking  status: Never   Smokeless tobacco: Never  Vaping Use   Vaping status: Never Used  Substance Use Topics   Alcohol use: No   Drug use: No     Allergies   Phenergan [promethazine]   Review of Systems Review of Systems  Musculoskeletal:        Left knee pain x 1 day secondary to working in the yard     Physical Exam Triage Vital Signs ED Triage Vitals  Encounter Vitals Group     BP      Systolic BP Percentile      Diastolic BP Percentile      Pulse      Resp      Temp      Temp src      SpO2      Weight      Height      Head Circumference      Peak Flow      Pain Score      Pain Loc      Pain Education      Exclude from Growth Chart    No data found.  Updated Vital Signs BP 126/76 (BP Location: Right Arm)   Pulse (!) 58   Temp 98.6 F (37 C) (Oral)   Resp 16   SpO2 94%    Physical Exam Vitals and nursing note reviewed.  Constitutional:      Appearance: Normal  appearance. He is obese.  HENT:     Head: Normocephalic and atraumatic.     Mouth/Throat:     Mouth: Mucous membranes are moist.     Pharynx: Oropharynx is clear.  Eyes:     Extraocular Movements: Extraocular movements intact.     Conjunctiva/sclera: Conjunctivae normal.     Pupils: Pupils are equal, round, and reactive to light.  Cardiovascular:     Rate and Rhythm: Normal rate and regular rhythm.     Pulses: Normal pulses.     Heart sounds: Normal heart sounds.  Pulmonary:     Effort: Pulmonary effort is normal.     Breath sounds: Normal breath sounds. No wheezing, rhonchi or rales.  Musculoskeletal:        General: Normal range of motion.     Cervical back: Normal range of motion and neck supple.     Comments: Left knee (anterior aspect of inferior patella) TTP, with mild soft tissue swelling noted, limited range of motion with flexion/extension  Skin:    General: Skin is warm and dry.  Neurological:     General: No focal deficit present.     Mental Status: He is alert and oriented  to person, place, and time. Mental status is at baseline.  Psychiatric:        Mood and Affect: Mood normal.        Behavior: Behavior normal.      UC Treatments / Results  Labs (all labs ordered are listed, but only abnormal results are displayed) Labs Reviewed - No data to display  EKG   Radiology DG Knee Complete 4 Views Left Result Date: 04/30/2024 CLINICAL DATA:  Knee pain and swelling after doing yard work. EXAM: LEFT KNEE - COMPLETE 4+ VIEW COMPARISON:  None Available. FINDINGS: No acute fracture. No dislocation. No worrisome lytic or sclerotic osseous abnormality. Mild degenerative spurring seen in all 3 compartments. No joint effusion. IMPRESSION: Mild degenerative changes without acute bony findings. Electronically Signed   By: Donnal Fusi M.D.   On: 04/30/2024 12:22    Procedures Procedures (including critical care time)  Medications Ordered in UC Medications - No data to display  Initial Impression / Assessment and Plan / UC Course  I have reviewed the triage vital signs and the nursing notes.  Pertinent labs & imaging results that were available during my care of the patient were reviewed by me and considered in my medical decision making (see chart for details).     MDM: 1.  Acute pain of left knee-left knee x-ray results revealed above, Rx'd Percocet 7.5/3 and 25 mg tablet: Take 1 tablet every 8 hours,/as needed for acute/severe left knee pain; 2.  Knee strain, left, initial encounter-left knee x-ray results revealed above, ace wrap placed on left knee prior to discharge with specific instructions for patient. Advised patient may RICE affected area of left knee for 20 minutes 3 times daily for the next 3 days.  Advised patient to take medication (Celebrex) as directed with food to completion.  Advised patient may take Percocet daily or as needed for left acute/severe knee pain.  Patient advised of sedative effects of this medication.  Encouraged to increase daily  water intake to 64 ounces per day while taking these medications.  Advised if symptoms worsen and/or unresolved please follow-up with your PCP or [facility] orthopedics for further evaluation.  Contact information provided with his AVS today.  Patient discharged home, hemodynamically stable.  Work note provided to patient  prior to discharge per request. Final Clinical Impressions(s) / UC Diagnoses   Final diagnoses:  Acute pain of left knee  Knee strain, left, initial encounter     Discharge Instructions      Advised patient may RICE affected area of left knee for 20 minutes 3 times daily for the next 3 days.  Advised patient to take medication (Celebrex) as directed with food to completion.  Advised patient may take Percocet daily or as needed for left acute/severe knee pain.  Patient advised of sedative effects of this medication.  Encouraged to increase daily water intake to 64 ounces per day while taking these medications.  Advised if symptoms worsen and/or unresolved please follow-up with your PCP or [facility] orthopedics for further evaluation.  Contact information provided with his AVS today.   ED Prescriptions     Medication Sig Dispense Auth. Provider   celecoxib (CELEBREX) 200 MG capsule Take 1 capsule (200 mg total) by mouth daily for 15 days. 15 capsule Keairra Bardon, FNP   oxyCODONE-acetaminophen (PERCOCET) 7.5-325 MG tablet Take 1 tablet by mouth every 8 (eight) hours as needed for severe pain (pain score 7-10). 15 tablet Jarel Cuadra, FNP      I have reviewed the PDMP during this encounter.   Leonides Ramp, FNP 04/30/24 1243

## 2024-07-27 ENCOUNTER — Ambulatory Visit
Admission: EM | Admit: 2024-07-27 | Discharge: 2024-07-27 | Disposition: A | Discharge status: Home / Self Care | Attending: Family Medicine | Admitting: Family Medicine

## 2024-07-27 DIAGNOSIS — M62830 Muscle spasm of back: Secondary | ICD-10-CM

## 2024-07-27 DIAGNOSIS — S29012A Strain of muscle and tendon of back wall of thorax, initial encounter: Secondary | ICD-10-CM | POA: Diagnosis not present

## 2024-07-27 MED ORDER — BACLOFEN 10 MG PO TABS
10.0000 mg | ORAL_TABLET | Freq: Three times a day (TID) | ORAL | 0 refills | Status: AC
Start: 2024-07-27 — End: ?

## 2024-07-27 MED ORDER — CELECOXIB 200 MG PO CAPS: 200.0000 mg | ORAL_CAPSULE | Freq: Every day | ORAL | 0 refills | Status: AC

## 2024-07-27 NOTE — Discharge Instructions (Addendum)
 Advised patient to take medication as directed with food to completion.  Advised may use baclofen  daily or as needed for accompanying muscle spasm of right thoracic region of back.  Encouraged increased daily water intake 64 ounces per day while taking these medications.  Advised if symptoms worsen and/or unresolved please follow-up with your PCP, [facility] orthopedics or here for further evaluation.

## 2024-07-27 NOTE — ED Triage Notes (Signed)
 Pt c/o RT sided back pain below shoulder blade since Monday. Denies injury. Heat, IcyHot and advil /tylenol  prn with no relief.

## 2024-07-27 NOTE — ED Provider Notes (Signed)
 [facility]    CSN: [id number] Arrival date & time: 07/27/24  1628      History   Chief Complaint Chief Complaint  Patient presents with   Back Pain    HPI Derrick Ray is a 58 y.o. male.   HPI Pleasant 58 year old male presents with right sided back pain below shoulder blade for 4 days.  PMH significant for obesity, T2DM, and HTN.  Past Medical History:  Diagnosis Date   Diabetes mellitus    Hypertension     There are no active problems to display for this patient.   History reviewed. No pertinent surgical history.     Home Medications    Prior to Admission medications   Medication Sig Start Date End Date Taking? Authorizing Provider  baclofen  (LIORESAL ) 10 MG tablet Take 1 tablet (10 mg total) by mouth 3 (three) times daily. 07/27/24  Yes Teddy Sharper, FNP  celecoxib  (CELEBREX ) 200 MG capsule Take 1 capsule (200 mg total) by mouth daily for 15 days. 07/27/24 08/11/24 Yes Teddy Sharper, FNP  dapagliflozin propanediol (FARXIGA) 10 MG TABS tablet Take 10 mg by mouth daily.    [provider]  LANTUS SOLOSTAR 100 UNIT/ML Solostar Pen SMARTSIG:70 Unit(s) SUB-Q Every Night    [provider]  lisinopril (PRINIVIL,ZESTRIL) 10 MG tablet Take 10 mg by mouth daily.    [provider]  metFORMIN (GLUCOPHAGE) 500 MG tablet Take by mouth daily with breakfast.    [provider]  MOUNJARO 15 MG/0.5ML Pen Inject 15 mg into the skin. 04/20/24   [provider]  oxyCODONE -acetaminophen  (PERCOCET) 7.5-325 MG tablet Take 1 tablet by mouth every 8 (eight) hours as needed for severe pain (pain score 7-10). 04/30/24   Teddy Sharper, FNP  testosterone cypionate (DEPOTESTOSTERONE CYPIONATE) 200 MG/ML injection Inject into the muscle every 14 (fourteen) days.    [provider]    Family History Family History  Problem Relation Age of Onset   Diabetes Mother    Diabetes Father     Social History Social History    Tobacco Use   Smoking status: Never   Smokeless tobacco: Never  Vaping Use   Vaping status: Never Used  Substance Use Topics   Alcohol use: No   Drug use: No     Allergies   Phenergan [promethazine]   Review of Systems Review of Systems  Musculoskeletal:  Positive for back pain.     Physical Exam Triage Vital Signs ED Triage Vitals  Encounter Vitals Group     BP 07/27/24 1700 121/71     Girls Systolic BP Percentile --      Girls Diastolic BP Percentile --      Boys Systolic BP Percentile --      Boys Diastolic BP Percentile --      Pulse Rate 07/27/24 1700 73     Resp 07/27/24 1700 17     Temp 07/27/24 1700 98.3 F (36.8 C)     Temp Source 07/27/24 1700 Oral     SpO2 07/27/24 1700 97 %     Weight --      Height --      Head Circumference --      Peak Flow --      Pain Score 07/27/24 1701 8     Pain Loc --      Pain Education --      Exclude from Growth Chart --    No data found.  Updated Vital Signs BP  121/71 (BP Location: Right Arm)   Pulse 73   Temp 98.3 F (36.8 C) (Oral)   Resp 17   SpO2 97%   Visual Acuity Right Eye Distance:   Left Eye Distance:   Bilateral Distance:    Right Eye Near:   Left Eye Near:    Bilateral Near:     Physical Exam Vitals and nursing note reviewed.  Constitutional:      Appearance: Normal appearance. He is normal weight.  HENT:     Head: Normocephalic and atraumatic.     Mouth/Throat:     Mouth: Mucous membranes are moist.     Pharynx: Oropharynx is clear.  Eyes:     Extraocular Movements: Extraocular movements intact.     Pupils: Pupils are equal, round, and reactive to light.  Cardiovascular:     Rate and Rhythm: Normal rate and regular rhythm.     Pulses: Normal pulses.     Heart sounds: Normal heart sounds.  Pulmonary:     Effort: Pulmonary effort is normal.     Breath sounds: Normal breath sounds. No wheezing, rhonchi or rales.  Musculoskeletal:        General: Normal range of motion.      Comments: Upper back (right sided thoracic/mid latissimus dorsi region): TTP, palpable muscle adhesions noted  Skin:    General: Skin is warm and dry.  Neurological:     General: No focal deficit present.     Mental Status: He is alert and oriented to person, place, and time. Mental status is at baseline.  Psychiatric:        Mood and Affect: Mood normal.        Behavior: Behavior normal.        Thought Content: Thought content normal.      UC Treatments / Results  Labs (all labs ordered are listed, but only abnormal results are displayed) Labs Reviewed - No data to display  EKG   Radiology No results found.  Procedures Procedures (including critical care time)  Medications Ordered in UC Medications - No data to display  Initial Impression / Assessment and Plan / UC Course  I have reviewed the triage vital signs and the nursing notes.  Pertinent labs & imaging results that were available during my care of the patient were reviewed by me and considered in my medical decision making (see chart for details).     MDM: 1.  Muscle strain of right upper back, initial encounter-Rx'd Celebrex  200 mg capsule: Take 1 capsule daily x 15 days; 2.  Muscle spasm of back-Rx'd baclofen  10 mg tablet: Take 1 tablet 3 times daily, as needed for right sided muscle spasms of upper back. Advised patient to take medication as directed with food to completion.  Advised may use baclofen  daily or as needed for accompanying muscle spasm of right thoracic region of back.  Encouraged increased daily water intake 64 ounces per day while taking these medications.  Advised if symptoms worsen and/or unresolved please follow-up with your PCP, [facility]oped[facility] or here for further evaluation.  Patient discharged home, hemodynamically stable Final Clinical Impressions(s) / UC Diagnoses   Final diagnoses:  Muscle strain of right upper back, initial encounter  Muscle spasm of back     Discharge  Instructions      Advised patient to take medication as directed with food to completion.  Advised may use baclofen  daily or as needed for accompanying muscle spasm of right thoracic region of back.  Encouraged increased daily water intake 64 ounces per day while taking these medications.  Advised if symptoms worsen and/or unresolved please follow-up with your PCP, [facility] or here for further evaluation.     ED Prescriptions     Medication Sig Dispense Auth. Provider   celecoxib  (CELEBREX ) 200 MG capsule Take 1 capsule (200 mg total) by mouth daily for 15 days. 15 capsule Ginnie Marich, FNP   baclofen  (LIORESAL ) 10 MG tablet Take 1 tablet (10 mg total) by mouth 3 (three) times daily. 30 each Teddy Sharper, FNP      I have reviewed the PDMP during this encounter.   Teddy Sharper, FNP 07/27/24 ZEB
